# Patient Record
Sex: Male | Born: 1990 | Race: Black or African American | Hispanic: No | Marital: Single | Smoking: Never smoker
Health system: Southern US, Community
[De-identification: ages and names within clinical notes are randomized; demographics above are authoritative.]

---

## 2013-03-19 ENCOUNTER — Encounter (HOSPITAL_COMMUNITY): Payer: Self-pay | Admitting: Emergency Medicine

## 2013-03-19 ENCOUNTER — Emergency Department (HOSPITAL_COMMUNITY)
Admission: EM | Admit: 2013-03-19 | Discharge: 2013-03-19 | Disposition: A | Discharge status: Home / Self Care | Payer: BC Managed Care – PPO | Source: Home / Self Care

## 2013-03-19 DIAGNOSIS — K625 Hemorrhage of anus and rectum: Secondary | ICD-10-CM

## 2013-03-19 DIAGNOSIS — K5900 Constipation, unspecified: Secondary | ICD-10-CM

## 2013-03-19 NOTE — Discharge Instructions (Signed)
Increase your fluids and fiber in your diet.  I've included information on warning signs for rectal bleeding for you to review.  Constipation, Adult Constipation is when a person has fewer than 3 bowel movements a week; has difficulty having a bowel movement; or has stools that are dry, hard, or larger than normal. As people grow older, constipation is more common. If you try to fix constipation with medicines that make you have a bowel movement (laxatives), the problem may get worse. Long-term laxative use may cause the muscles of the colon to become weak. A low-fiber diet, not taking in enough fluids, and taking certain medicines may make constipation worse. CAUSES   Certain medicines, such as antidepressants, pain medicine, iron supplements, antacids, and water pills.   Certain diseases, such as diabetes, irritable bowel syndrome (IBS), thyroid disease, or depression.   Not drinking enough water.   Not eating enough fiber-rich foods.   Stress or travel.  Lack of physical activity or exercise.  Not going to the restroom when there is the urge to have a bowel movement.  Ignoring the urge to have a bowel movement.  Using laxatives too much. SYMPTOMS   Having fewer than 3 bowel movements a week.   Straining to have a bowel movement.   Having hard, dry, or larger than normal stools.   Feeling full or bloated.   Pain in the lower abdomen.  Not feeling relief after having a bowel movement. DIAGNOSIS  Your caregiver will take a medical history and perform a physical exam. Further testing may be done for severe constipation. Some tests may include:   A barium enema X-ray to examine your rectum, colon, and sometimes, your small intestine.  A sigmoidoscopy to examine your lower colon.  A colonoscopy to examine your entire colon. TREATMENT  Treatment will depend on the severity of your constipation and what is causing it. Some dietary treatments include drinking more  fluids and eating more fiber-rich foods. Lifestyle treatments may include regular exercise. If these diet and lifestyle recommendations do not help, your caregiver may recommend taking over-the-counter laxative medicines to help you have bowel movements. Prescription medicines may be prescribed if over-the-counter medicines do not work.  HOME CARE INSTRUCTIONS   Increase dietary fiber in your diet, such as fruits, vegetables, whole grains, and beans. Limit high-fat and processed sugars in your diet, such as [place] fries, hamburgers, cookies, candies, and soda.   A fiber supplement may be added to your diet if you cannot get enough fiber from foods.   Drink enough fluids to keep your urine clear or pale yellow.   Exercise regularly or as directed by your caregiver.   Go to the restroom when you have the urge to go. Do not hold it.  Only take medicines as directed by your caregiver. Do not take other medicines for constipation without talking to your caregiver first. SEEK IMMEDIATE MEDICAL CARE IF:   You have bright red blood in your stool.   Your constipation lasts for more than 4 days or gets worse.   You have abdominal or rectal pain.   You have thin, pencil-like stools.  You have unexplained weight loss. MAKE SURE YOU:   Understand these instructions.  Will watch your condition.  Will get help right away if you are not doing well or get worse. Document Released: 10/22/2003 Document Revised: 04/17/2011 Document Reviewed: 11/04/2012 [facility] Patient Information 2014 [employer], [place].  Rectal Bleeding Rectal bleeding is when blood passes out of the anus.  It is usually a sign that something is wrong. It may not be serious, but it should always be evaluated. Rectal bleeding may present as bright red blood or extremely dark stools. The color may range from dark red or maroon to black (like tar). It is important that the cause of rectal bleeding be identified so treatment can be  started and the problem corrected. CAUSES   Hemorrhoids. These are enlarged (dilated) blood vessels or veins in the anal or rectal area.  Fistulas. Theseare abnormal, burrowing channels that usually run from inside the rectum to the skin around the anus. They can bleed.  Anal fissures. This is a tear in the tissue of the anus. Bleeding occurs with bowel movements.  Diverticulosis. This is a condition in which pockets or sacs project from the bowel wall. Occasionally, the sacs can bleed.  Diverticulitis. Thisis an infection involving diverticulosis of the colon.  Proctitis and colitis. These are conditions in which the rectum, colon, or both, can become inflamed and pitted (ulcerated).  Polyps and cancer. Polyps are non-cancerous (benign) growths in the colon that may bleed. Certain types of polyps turn into cancer.  Protrusion of the rectum. Part of the rectum can project from the anus and bleed.  Certain medicines.  Intestinal infections.  Blood vessel abnormalities. HOME CARE INSTRUCTIONS  Eat a high-fiber diet to keep your stool soft.  Limit activity.  Drink enough fluids to keep your urine clear or pale yellow.  Warm baths may be useful to soothe rectal pain.  Follow up with your caregiver as directed. SEEK IMMEDIATE MEDICAL CARE IF:  You develop increased bleeding.  You have black or dark red stools.  You vomit blood or material that looks like coffee grounds.  You have abdominal pain or tenderness.  You have a fever.  You feel weak, nauseous, or you faint.  You have severe rectal pain or you are unable to have a bowel movement. MAKE SURE YOU:  Understand these instructions.  Will watch your condition.  Will get help right away if you are not doing well or get worse. Document Released: 07/15/2001 Document Revised: 04/17/2011 Document Reviewed: 07/10/2010 [facility] Patient Information 2014 [place], [place].

## 2013-03-19 NOTE — ED Notes (Signed)
States he has noted blood when cleaning himself after stool. No blood in toilet, no rectal FB sensation no persistent diarrhea or constipation; NAD

## 2013-03-19 NOTE — ED Provider Notes (Signed)
CSN: [id number]     Arrival date & time 03/19/13  1604 History   None    Chief Complaint  Patient presents with  . Rectal Bleeding    (Consider location/radiation/quality/duration/timing/severity/associated sxs/prior Treatment)  HPI  The patient is a healthy 23 year old male presenting today with a complaint of bleeding following a stool yesterday. Patient states he was cleaning himself and noticed "spots" of bright red blood on the toilet tissue. The patient denies any pain or discomfort at this time or with bleeding. Patient denies any abdominal pain, nausea, vomiting, and diarrhea.  History reviewed. No pertinent past medical history. History reviewed. No pertinent past surgical history. History reviewed. No pertinent family history. History  Substance Use Topics  . Smoking status: Never Smoker   . Smokeless tobacco: Not on file  . Alcohol Use: Yes    Review of Systems  Constitutional: Negative.   HENT: Negative.   Eyes: Negative.   Respiratory: Negative.   Cardiovascular: Negative.   Gastrointestinal: Negative.   Endocrine: Negative.   Genitourinary: Negative.   Musculoskeletal: Negative.   Skin: Negative.   Allergic/Immunologic: Negative.   Neurological: Negative.   Hematological: Negative.   Psychiatric/Behavioral: Negative.      Allergies  Review of patient's allergies indicates no known allergies.  Home Medications  No current outpatient prescriptions on file. BP 149/73  Pulse 61  Temp(Src) 98.7 F (37.1 C) (Oral)  Resp 17  SpO2 97%  Physical Exam  Nursing note and vitals reviewed. Constitutional: He appears well-developed and well-nourished. No distress.  Cardiovascular: Normal rate, regular rhythm, normal heart sounds and intact distal pulses.  Exam reveals no gallop and no friction rub.   No murmur heard. Pulmonary/Chest: Effort normal and breath sounds normal. No respiratory distress. He has no wheezes. He has no rales. He exhibits no tenderness.   Abdominal: Soft. Bowel sounds are normal. He exhibits no distension and no mass. There is no tenderness. There is no rebound and no guarding.  Genitourinary: Guaiac negative stool.  Skin: He is not diaphoretic.   There is no evidence of anal fissure or hemorrhoid extending from the anal verge to the dentate line.   ED Course  Procedures (including critical care time) Labs Review Labs Reviewed - No data to display Imaging Review No results found.    MDM   Final diagnoses:  Rectal bleeding  Constipation    Discussed the importance of water and fiber in the patient's daily diet. Patient verbalizes understanding.    David Cooksatherine Luby Seamans, NP 03/19/13 1719

## 2013-03-20 NOTE — ED Provider Notes (Signed)
Medical screening examination/treatment/procedure(s) were performed by non-physician practitioner and as supervising physician I was immediately available for consultation/collaboration.  Buren Havey, M.D.  Sarinah Doetsch C Jenaveve Fenstermaker, MD 03/20/13 0752 

## 2013-12-19 ENCOUNTER — Other Ambulatory Visit: Payer: Self-pay | Admitting: Family Medicine

## 2013-12-19 DIAGNOSIS — M25571 Pain in right ankle and joints of right foot: Secondary | ICD-10-CM

## 2013-12-23 ENCOUNTER — Ambulatory Visit
Admission: RE | Admit: 2013-12-23 | Discharge: 2013-12-23 | Disposition: A | Discharge status: Home / Self Care | Payer: BC Managed Care – PPO | Source: Ambulatory Visit | Attending: Family Medicine | Admitting: Family Medicine

## 2013-12-23 DIAGNOSIS — M25571 Pain in right ankle and joints of right foot: Secondary | ICD-10-CM

## 2014-03-19 ENCOUNTER — Other Ambulatory Visit: Payer: Self-pay | Admitting: Orthopedic Surgery

## 2014-03-19 DIAGNOSIS — L905 Scar conditions and fibrosis of skin: Secondary | ICD-10-CM

## 2014-03-23 ENCOUNTER — Other Ambulatory Visit: Payer: Self-pay

## 2015-05-10 IMAGING — CT CT ANKLE*R* W/O CM
1 of 5 series · 2 of 14 positions shown, 3 images · non-contrast
Comparison: None.

CLINICAL DATA: Medial ankle pain for 4 months following football
injury. Question healed fracture. No previous relevant surgery.
Initial encounter.

EXAM:
CT OF THE RIGHT ANKLE WITHOUT CONTRAST
TECHNIQUE: Multidetector CT imaging of the right ankle was performed according
to the standard protocol. Multiplanar CT image reconstructions were
also generated.

[Series 105: axial st · axial · 0.43mm/px · z∈[-68,-5]mm · 2 of 97 slices shown, 3 images]
[im 33/97  soft-tissue]
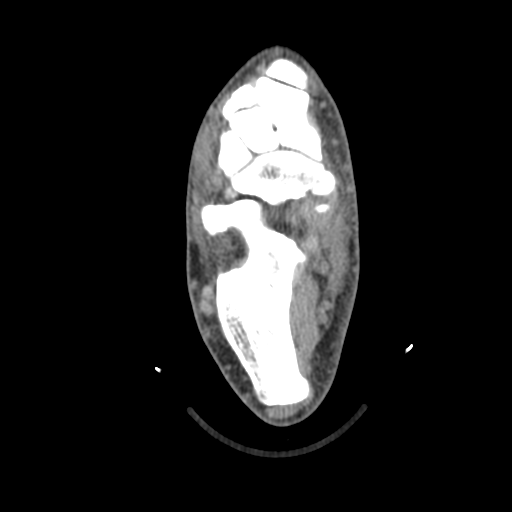
[im 33/97  bone]
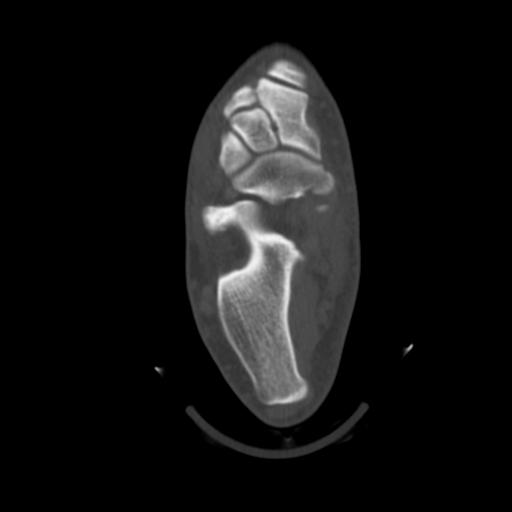
[im 65/97  bone]
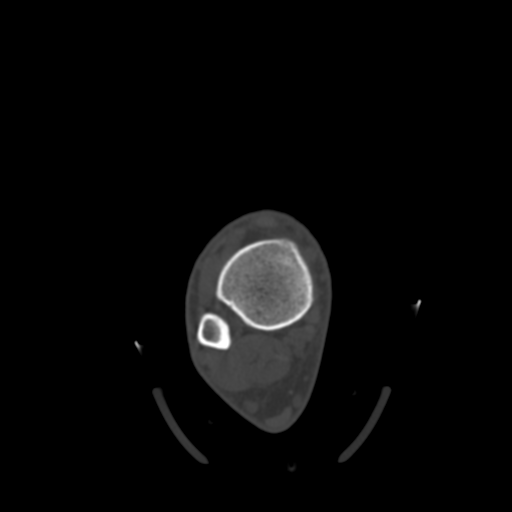

[2 of 14 positions shown; findings below may reference images not displayed]

FINDINGS: There is a nondisplaced fracture involving the base of the medial
malleolus, extending to the articular surface of the tibial plafond
medially. The fracture lines are indistinct, and this fracture may
be partially healed. No other fractures are demonstrated. There is
lateral talar spurring near the anterior talofibular ligament
attachment. The distal fibula appears intact. The talar dome appears
intact. Mild anterior tibial spurring is also noted.

No significant ankle joint effusion or intra-articular loose body
identified.

As evaluated by CT, the ankle tendons appear intact.
IMPRESSION: 1. Nondisplaced fracture involving the base of the medial malleolus.
This fracture is indistinct and possibly partially healed.
2. No other acute osseous findings or osteochondral lesions.
3. Probable posttraumatic lateral talar spurring at the anterior
talofibular ligament attachment.
